# Patient Record
Sex: Male | Born: 1950 | Hispanic: Yes | Marital: Single
Health system: Southern US, Community
[De-identification: ages and names within clinical notes are randomized; demographics above are authoritative.]

---

## 2011-10-18 ENCOUNTER — Inpatient Hospital Stay: Payer: Self-pay | Admitting: *Deleted

## 2013-02-22 IMAGING — CR DG CHEST 1V PORT
1 series · 1 of 1 positions shown · non-contrast
Comparison: none

REASON FOR EXAM: cp
COMMENTS:

PROCEDURE:     DXR - DXR PORTABLE CHEST SINGLE VIEW  - October 18, 2011 [DATE]
RESULT:     The lung fields are clear. No pneumonia, pneumothorax or pleural
effusion is seen. Heart size is normal. Monitoring electrodes are present.

[portable]
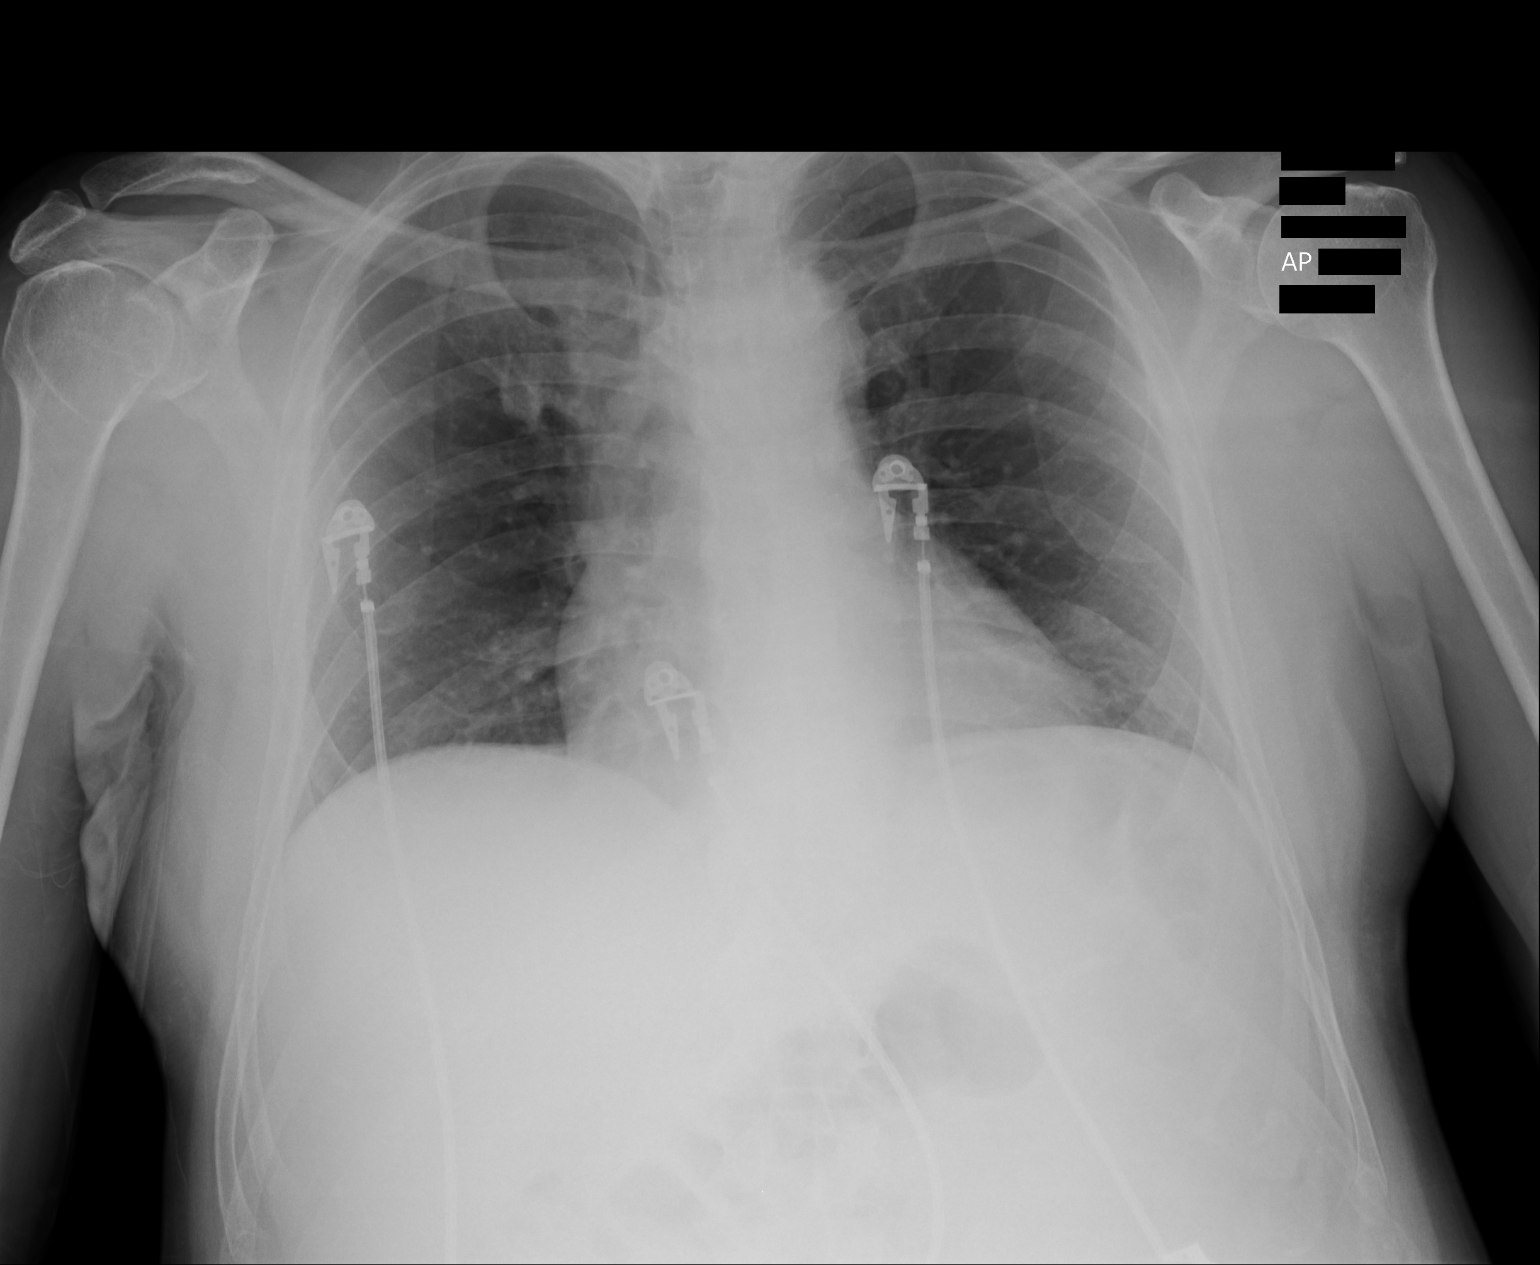

[1 of 1 positions shown; findings below may reference images not displayed]

IMPRESSION: No acute changes are identified.

## 2013-02-23 IMAGING — CT CT CHEST W/ CM
2 series · 15 of 31 positions shown, 19 images · IV contrast (isovue)
Comparison: none

REASON FOR EXAM: chest pain/hemoptisis
COMMENTS:

PROCEDURE:     CT  - CT CHEST (FOR PE) W  - October 19, 2011  [DATE]
RESULT:     Comparison: None
TECHNIQUE: Multiple thin section axial images were obtained from the lung
apices to the upper abdomen following 100 ml Isovue 370 intravenous
contrast, according to the PE protocol. These images were also reviewed on a
[employer] multiplanar work station.

[Series 5: soft tissue · axial · 0.66mm/px · z∈[-300,-261]mm · 2 of 85 slices shown]
[im 7/85  mediastinal]
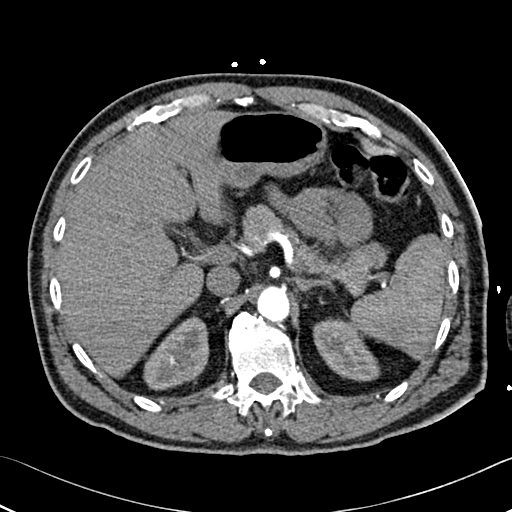
[im 20/85  mediastinal]
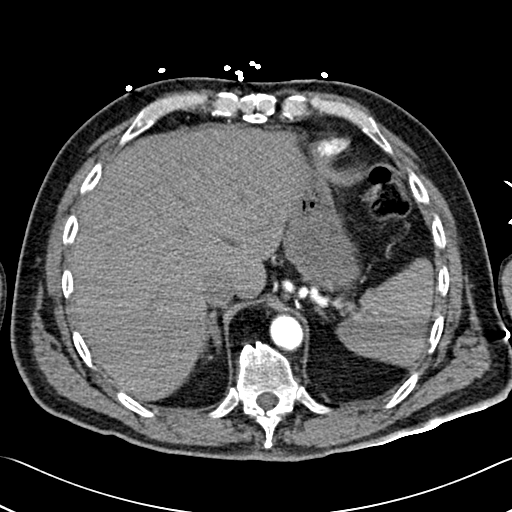

[Series 6: lung windows · axial · 0.66mm/px · z∈[-294,-87]mm · 13 of 83 slices shown, 17 images]
[im 7/83  mediastinal]
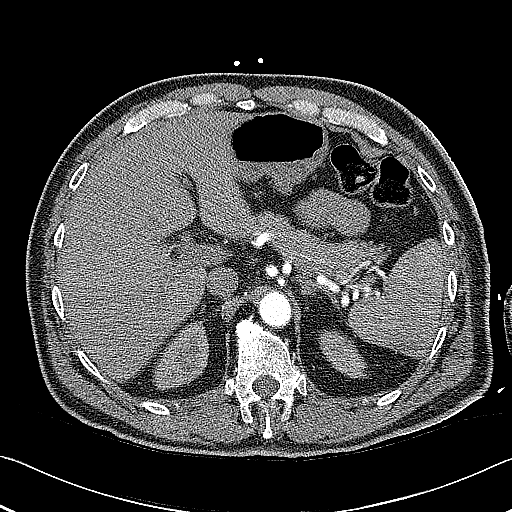
[im 7/83  lung]
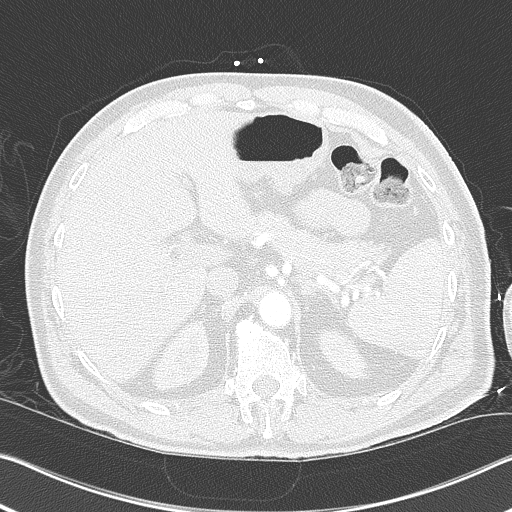
[im 13/83  lung]
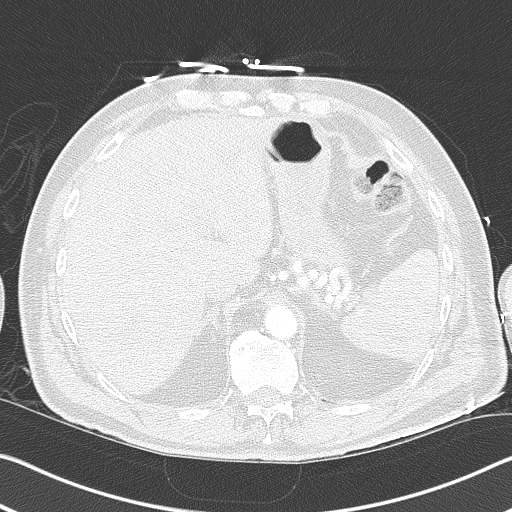
[im 19/83  lung]
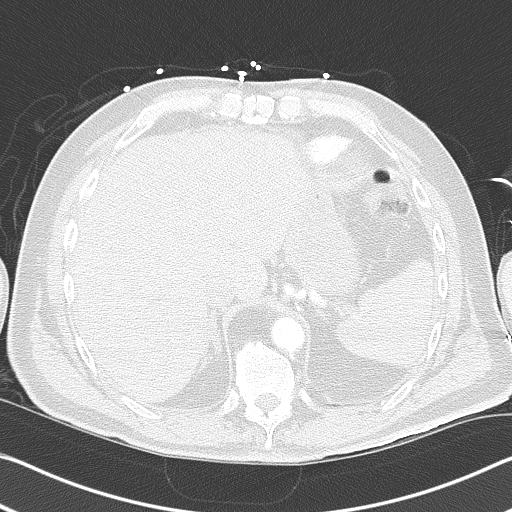
[im 26/83  lung]
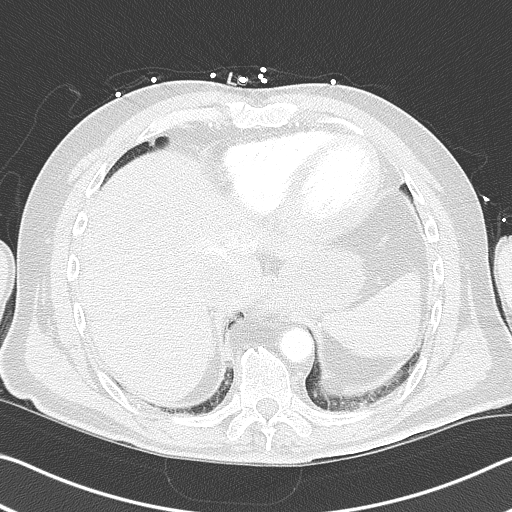
[im 32/83  mediastinal]
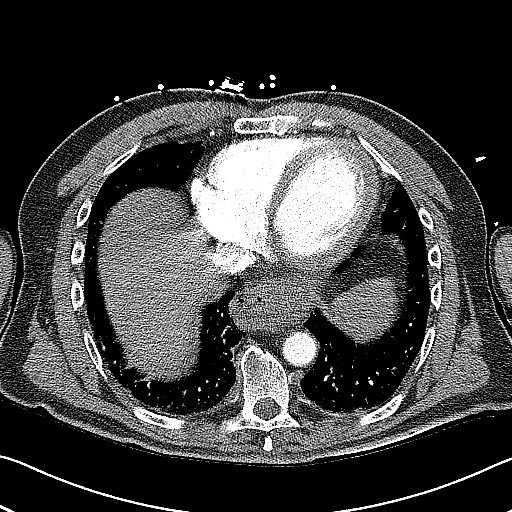
[im 32/83  lung]
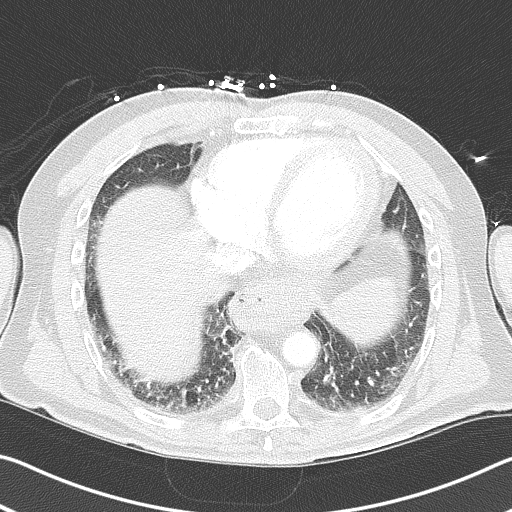
[im 38/83  lung]
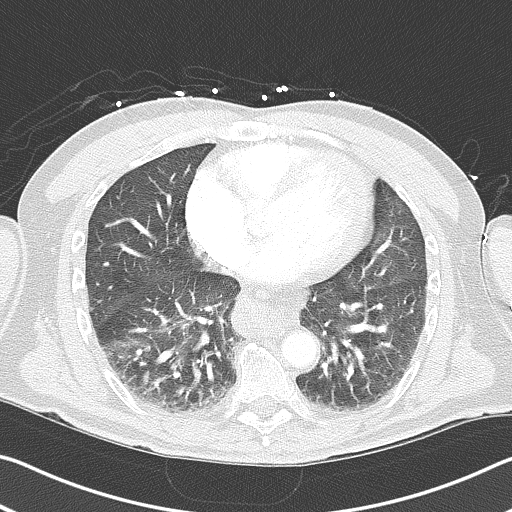
[im 42/83  lung]
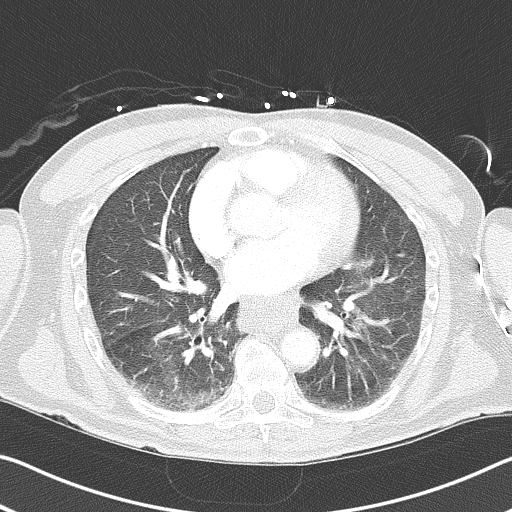
[im 45/83  lung]
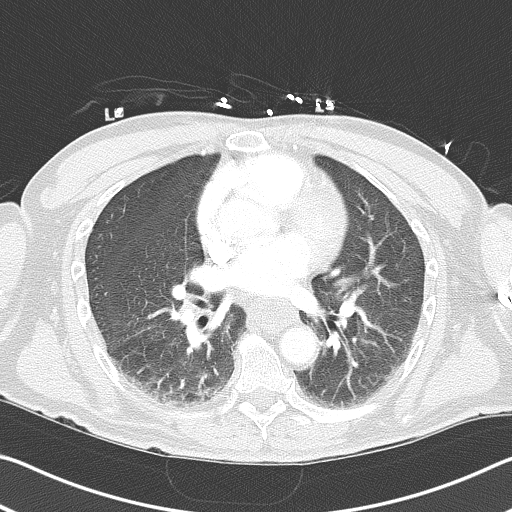
[im 51/83  mediastinal]
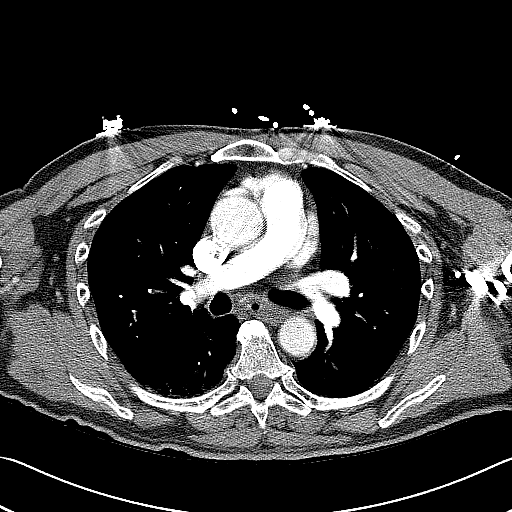
[im 51/83  lung]
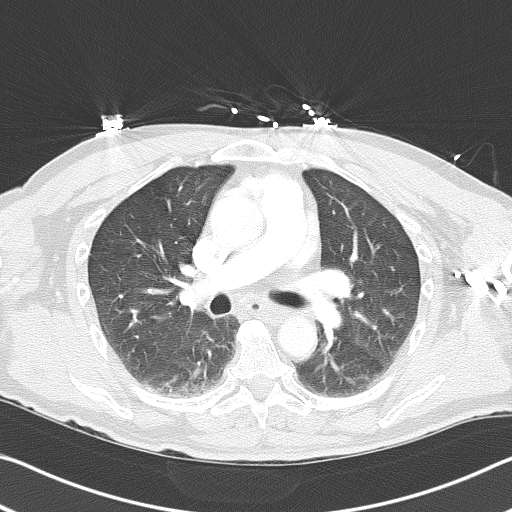
[im 57/83  lung]
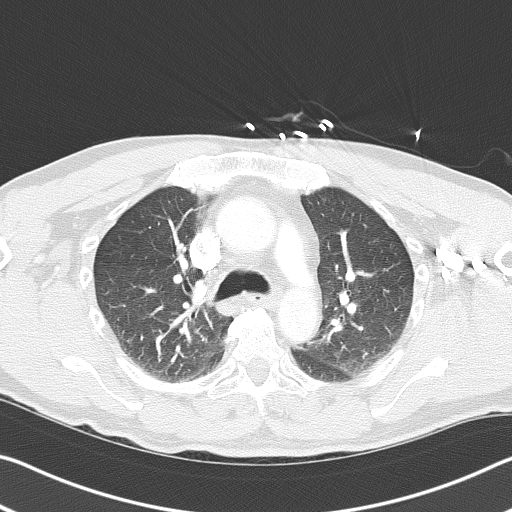
[im 64/83  lung]
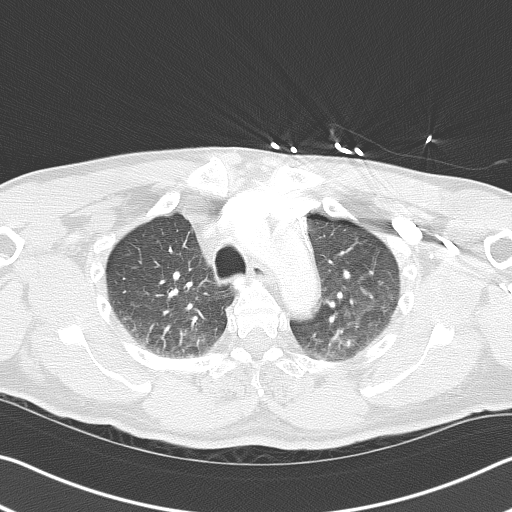
[im 70/83  lung]
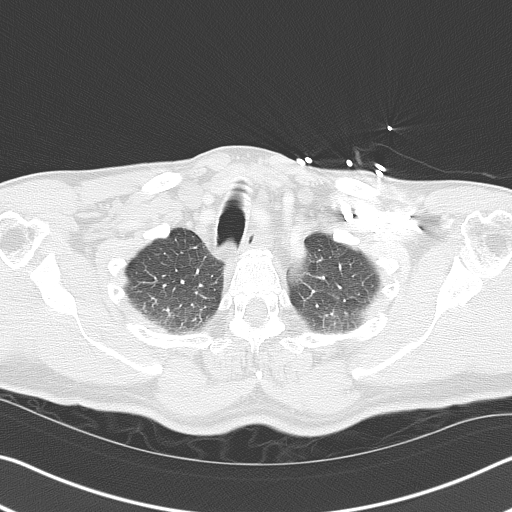
[im 76/83  mediastinal]
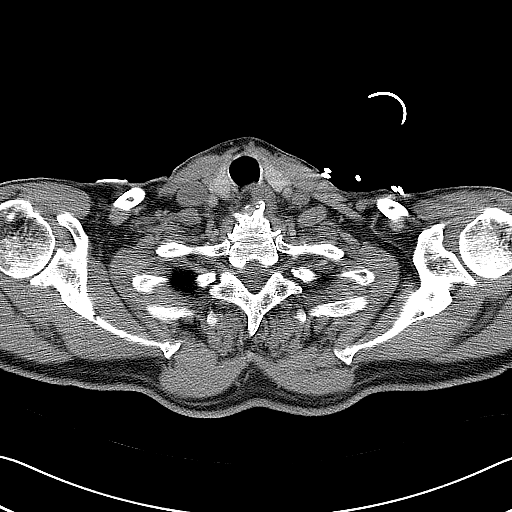
[im 76/83  lung]
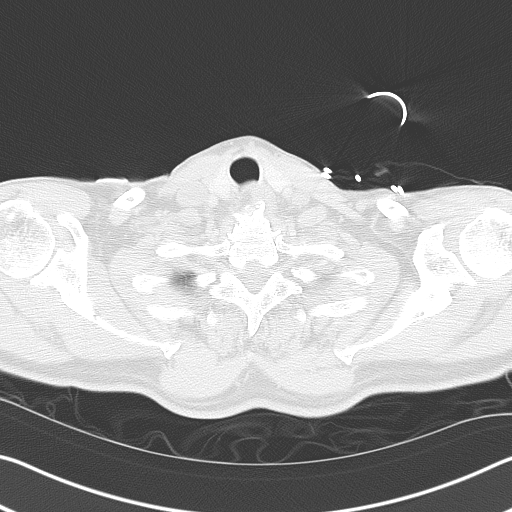

[15 of 31 positions shown; findings below may reference images not displayed]

FINDINGS: No mediastinal, hilar, or axillary lymphadenopathy. There is a moderate size
hiatal hernia.

The thoracic aorta is normal in caliber. Evaluation of the segmental
pulmonary arteries is limited by respiratory motion. Otherwise, no pulmonary
embolus identified. Mild basilar opacities and scattered groundglass
opacities likely represent atelectasis.

No aggressive lytic or sclerotic osseous lesions are identified.
IMPRESSION: 1. No pulmonary embolus identified. Evaluation of the segmental pulmonary
arteries is limited by respiratory motion.
2. Findings which likely represent mild atelectasis. A nonspecific
alveolitis is a differential consideration.

## 2015-02-22 NOTE — Consult Note (Signed)
PATIENT NAME:  [name], [name] MR#:  [id number] DATE OF BIRTH:  1951-05-06  DATE OF CONSULTATION:  10/19/2011  REFERRING PHYSICIAN:  Dr. Allena KatzPatel  CONSULTING PHYSICIAN:  Marcina MillardAlexander Zamiya Dillard, MD  CHIEF COMPLAINT: Body ache and vomiting.   HISTORY OF PRESENT ILLNESS: History is obtained by nephew and nieces. 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 The patient apparently was in his usual state of health until earlier this afternoon at 4 o'clock on 10/18/2011. The patient apparently ate, became sick to his stomach, became nauseous and vomited, and complained of dizziness and chest discomfort. The patient is mentally challenged and is unable to verbalize in detail his symptoms but family members noted that he had withdrawn. The [facility] where initial EKG revealed 1 mm of ST elevation in lead III and aVF with Q waves consistent with acute inferior myocardial infarction. Apparently [facility] and was reviewed and the patient was not transferred primarily due to the fact that there was a large communication barrier between patient and physician. Admission labs were notable for elevated CPK and MB of 335 and 45.8, respectively. Troponin was 2.8.   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 The patient has not taken an alcoholic beverage in 10 to 15 years according to family members.   MEDICATIONS: None.   SOCIAL HISTORY: The patient lives with his niece and nephew and apparently is quite active.   REVIEW OF SYSTEMS: Unobtainable.   PHYSICAL EXAMINATION:   VITAL SIGNS: Blood pressure 153/90, pulse 87.   GENERAL: The patient is an elderly gentleman who appears his stated age.   HEENT: Pupils equal and reactive to light and accommodation.   NECK: Supple without thyromegaly.   LUNGS: Clear.    CARDIOVASCULAR: Normal jugular venous pressure. Normal point of maximal impulse. Regular rate and rhythm. Normal S1, S2. No appreciable gallop, murmur, or rub.   ABDOMEN: Soft, nontender. Pulses were intact bilaterally.   MUSCULOSKELETAL: Normal muscle tone.   NEUROLOGIC: The patient was alert. Does not speak [place]. Unable to assess further.   IMPRESSION: This is a 64 year old mentally challenged Hispanic gentleman who presents with acute inferior wall myocardial infarction complicated by bradycardia and hypertension, possible right ventricular infarction as well who responded to atropine and fluid bolus with improved hemodynamics. The patient continues to have chest discomfort which is hard to discern because of general complaints of body ache. The patient had experienced coffee-ground emesis consistent with GI bleed earlier.   RECOMMENDATIONS:  1. Agree with current therapy.  2. Would defer [facility] at this [facility] for immediate PCI.  3. Would cancel CT of the chest since I don't feel the patient has a pulmonary embolus and is more likely consistent with acute inferior wall myocardial infarction.  4. 2-D echocardiogram to evaluate left ventricular function.  5. Further recommendations including cardiac catheterization pending the patient's clinical course.  6. Repeat atropine if the patient becomes bradycardic.   ____________________________ Marcina MillardAlexander Elga Santy, MD ap:drc D: 10/19/2011 01:36:59 ET T: 10/19/2011 08:06:16 ET JOB#: [id number]  cc: Marcina MillardAlexander Jakob Kimberlin, MD, <Dictator> Marcina MillardALEXANDER Julliette Frentz MD ELECTRONICALLY SIGNED 11/12/2011 10:03

## 2015-02-22 NOTE — Discharge Summary (Signed)
PATIENT NAMEOUSMANE, [name] MR#:  [id number] DATE OF BIRTH:  1950/12/23  DATE OF ADMISSION:  10/18/2011 DATE OF DISCHARGE:  10/24/2011  DISCHARGE DIAGNOSES:  1. Acute inferior wall myocardial infarction, ST-elevation myocardial infarction.  2. Bradycardia most likely secondary to inferior wall involvement, now improved. 3. Hypotension, resolved. 4. Ischemic cardiomyopathy, not in congestive heart failure.  5. Mental retardation.  6. Hypokalemia.  7. Hypomagnesemia, resolved. 8. Thrombocytopenia, may be secondary to alcohol abuse.   CONSULTATION: Cardiology, Dr. Darrold Junker.   HOSPITAL COURSE: A 64 year old male who has history of mental retardation, possible history of drug abuse also. He was admitted for chest pain. He lives with his niece and his sister who is his healthcare power of attorney. He was found to have severe bradycardia when he came in with midinferior wall myocardial infarction. His EKG shows that he had some ST elevation in the inferior leads. Patient was started on Lovenox. He was started on aspirin. Cannot be given beta blocker because of severe bradycardia. He was started on statin, nitro patch. He was hypotensive when he came in. He was given IV fluids for hypotension. He was also placed on p.r.n. atropine for bradycardia. He also got a CT chest at admission that was negative for pulmonary embolus. Some atelectasis may be present, nonspecific alveolitis. Dr. Darrold Junker saw him as a cardiology consultant. Apparently the EKG was faxed to [facility] at admission and it was reviewed but patient was not transferred due to a large communication barrier between the patient and the physician. He ruled in for myocardial infarction.Cardiology suggested an echocardiogram so echocardiogram was done on the patient which showed the patient had ejection fraction of 25% to 35%, LV function is moderately to severely reduced, mild to moderate AR, mild to moderate MR. Patient has  severe mental retardation. Patient cannot understand his medical condition or his treatment. His family was informed throughout the hospital course. The patient kept on during the hospital stay that he is hurting all over although he was comfortable. Cardiology decided not to catheterize him considering his severe mental retardation, lack of capacity to understand and communication barrier definite challenge in terms of care so he suggested medical management so patient is on aspirin, statin, ACE inhibitor has been started on the patient. His blood pressure has improved. He has no further severe bradycardia. He did not require atropine for more than 48 hours now. Not a candidate for pacemaker. When he came in, his creatinine was normal at 1.09, AST was slightly elevated at 68 but ALT was normal at 36. His lipase was normal. His initial troponin was 2.8, with CK 335, MB 45.8. His troponin jumped up to 15.4, with CK of 836 and MB 117.8 but then it downtrended to 7.83 and 7.73. He was also hypokalemic and hypomagnesemic during the hospital stay which have been aggressively replaced. His last potassium today is 3.5 and his magnesium is 1.9. His AST is still a little bit high at 80 so I am going to decrease his simvastatin to 20 mg at bedtime. His ALT and alkaline phosphatase is normal. His urinalysis was negative. He got Lovenox initially therapeutic doses and that was stopped. His INR was 0.9, his d-dimer was 1.81 but his CT chest is negative. His repeat EKG was done on December 20 which showed his ST changes had improved in the inferior leads, but still had some sinus bradycardia at 55. Discussed with cardiology at the time of discharge. Will give him close follow  up with Dr. Darrold Camacho. He has been set up with [facility]. Home health has been arranged for the patient.   MEDICATIONS AT DISCHARGE:  1. Ecotrin 325 mg p.o. once daily. 2. Lisinopril 10 mg p.o. once daily. 3. Simvastatin 20 mg p.o. at bedtime.   4. Omeprazole 20 mg p.o. once daily.   DIET: Advised a low-sodium, low-cholesterol diet.   ACTIVITY: As tolerated.   CONDITION AT DISCHARGE: T-max 98.6, heart rate 59, blood pressure 145/82 to 137/92, saturating 96% on room air. Chest is clear. Heart sounds are regular. Abdomen soft, nontender. Patient is alert but not oriented, mentally retarded.   FOLLOW UP: Patient should follow up with [facility] in 1 to 2 weeks. Follow up LFTs at [facility] and BMP. If LFTs have improved then his statin dose can be increased. Also if his blood pressure remains stable then his ACE inhibitor can be further advanced as outpatient. Follow up with [facility] Darrold Camacho, [facility] cardiology, in one week.       TIME SPENT WITH DISCHARGE: 40 mins.   ____________________________ William SorrowAbhinav Krisinda Giovanni, MD ag:cms D: 10/24/2011 10:55:54 ET T: 10/26/2011 10:42:55 ET JOB#: [id number]  cc: William SorrowAbhinav Sahalie Beth, MD, <Dictator> [facility] William MillardAlexander Paraschos, MD William SorrowABHINAV Javar Eshbach MD ELECTRONICALLY SIGNED 11/18/2011 11:57

## 2018-02-28 DEATH — deceased
